# Patient Record
Sex: Male | Born: 1985 | Race: Black or African American | Hispanic: No | Marital: Single | Smoking: Current some day smoker
Health system: Southern US, Community
[De-identification: ages and names within clinical notes are randomized; demographics above are authoritative.]

---
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
## 2014-04-11 ENCOUNTER — Encounter (HOSPITAL_COMMUNITY): Payer: Self-pay | Admitting: *Deleted

## 2014-04-11 ENCOUNTER — Emergency Department (HOSPITAL_COMMUNITY)
Admission: EM | Admit: 2014-04-11 | Discharge: 2014-04-11 | Disposition: A | Discharge status: Home / Self Care | Payer: Self-pay | Attending: Emergency Medicine | Admitting: Emergency Medicine

## 2014-04-11 DIAGNOSIS — R519 Headache, unspecified: Secondary | ICD-10-CM

## 2014-04-11 DIAGNOSIS — Z720 Tobacco use: Secondary | ICD-10-CM | POA: Insufficient documentation

## 2014-04-11 DIAGNOSIS — R1110 Vomiting, unspecified: Secondary | ICD-10-CM | POA: Insufficient documentation

## 2014-04-11 DIAGNOSIS — R51 Headache: Secondary | ICD-10-CM | POA: Insufficient documentation

## 2014-04-11 DIAGNOSIS — Z79899 Other long term (current) drug therapy: Secondary | ICD-10-CM | POA: Insufficient documentation

## 2014-04-11 DIAGNOSIS — Z794 Long term (current) use of insulin: Secondary | ICD-10-CM | POA: Insufficient documentation

## 2014-04-11 DIAGNOSIS — H5310 Unspecified subjective visual disturbances: Secondary | ICD-10-CM | POA: Insufficient documentation

## 2014-04-11 MED ORDER — TRAMADOL HCL 50 MG PO TABS: 50.0000 mg | ORAL_TABLET | Freq: Four times a day (QID) | ORAL | Status: AC | PRN

## 2014-04-11 MED ORDER — ONDANSETRON HCL 4 MG PO TABS: 4.0000 mg | ORAL_TABLET | Freq: Four times a day (QID) | ORAL | Status: AC

## 2014-04-11 NOTE — ED Notes (Signed)
CBG- 92 

## 2014-04-11 NOTE — Discharge Instructions (Signed)
Recurrent Migraine Headache A migraine headache is an intense, throbbing pain on one or both sides of your head. Recurrent migraines keep coming back. A migraine can last for 30 minutes to several hours. CAUSES  The exact cause of a migraine headache is not always known. However, a migraine may be caused when nerves in the brain become irritated and release chemicals that cause inflammation. This causes pain. Certain things may also trigger migraines, such as:   Alcohol.  Smoking.  Stress.  Menstruation.  Aged cheeses.  Foods or drinks that contain nitrates, glutamate, aspartame, or tyramine.  Lack of sleep.  Chocolate.  Caffeine.  Hunger.  Physical exertion.  Fatigue.  Medicines used to treat chest pain (nitroglycerine), birth control pills, estrogen, and some blood pressure medicines. SYMPTOMS   Pain on one or both sides of your head.  Pulsating or throbbing pain.  Severe pain that prevents daily activities.  Pain that is aggravated by any physical activity.  Nausea, vomiting, or both.  Dizziness.  Pain with exposure to bright lights, loud noises, or activity.  General sensitivity to bright lights, loud noises, or smells. Before you get a migraine, you may get warning signs that a migraine is coming (aura). An aura may include:  Seeing flashing lights.  Seeing bright spots, halos, or zigzag lines.  Having tunnel vision or blurred vision.  Having feelings of numbness or tingling.  Having trouble talking.  Having muscle weakness. DIAGNOSIS  A recurrent migraine headache is often diagnosed based on:  Symptoms.  Physical examination.  A CT scan or MRI of your head. These imaging tests cannot diagnose migraines but can help rule out other causes of headaches.  TREATMENT  Medicines may be given for pain and nausea. Medicines can also be given to help prevent recurrent migraines. HOME CARE INSTRUCTIONS  Only take over-the-counter or prescription  medicines for pain or discomfort as directed by your health care provider. The use of long-term narcotics is not recommended.  Lie down in a dark, quiet room when you have a migraine.  Keep a journal to find out what may trigger your migraine headaches. For example, write down:  What you eat and drink.  How much sleep you get.  Any change to your diet or medicines.  Limit alcohol consumption.  Quit smoking if you smoke.  Get 7-9 hours of sleep, or as recommended by your health care provider.  Limit stress.  Keep lights dim if bright lights bother you and make your migraines worse. SEEK MEDICAL CARE IF:   You do not get relief from the medicines given to you.  You have a recurrence of pain.  You have a fever. SEEK IMMEDIATE MEDICAL CARE IF:  Your migraine becomes severe.  You have a stiff neck.  You have loss of vision.  You have muscular weakness or loss of muscle control.  You start losing your balance or have trouble walking.  You feel faint or pass out.  You have severe symptoms that are different from your first symptoms. MAKE SURE YOU:   Understand these instructions.  Will watch your condition.  Will get help right away if you are not doing well or get worse. Document Released: 12/27/2000 Document Revised: 08/18/2013 Document Reviewed: 12/09/2012 [employer] Patient Information 2015 [employer]. This information is not intended to replace advice given to you by your health care provider. Make sure you discuss any questions you have with your health care provider.  

## 2014-04-11 NOTE — ED Provider Notes (Signed)
CSN: [id number]     Arrival date & time 04/11/14  1257 History  This chart was scribed for non-physician practitioner Marlon Peliffany Brianne Maina, PA-C working with Lyanne CoKevin M Campos, MD by Conchita ParisNadim Abuhashem, ED Scribe. This patient was seen in [facility] and the patient's care was started at 2:00 PM.    Chief Complaint  Patient presents with  . Headache  . Emesis   Patient is a 28 y.o. male presenting with headaches and vomiting. The history is provided by the patient. No language interpreter was used.  Headache Associated symptoms: dizziness, photophobia and vomiting   Emesis Associated symptoms: headaches     HPI Comments: Blake Contreras is a 28 y.o. male who presents to the Emergency Department complaining of re-occuring HA located at his bilateral temple worse on the right and behind his eyes.The HA are primarily at night and has occurs about 3 times a month. Pt states this complaint have been ongoing for years and its frequency has increased this past year. He currently has a small HA. He has emesis, photosensitivity, dizziness and imbalance as associated symptoms. Cooling down provides relief. Tylenol, Aleve and Excedrin occasionally provides relief.   He has been told it maybe about his blood sugar levels. Pt has glasses and does not wear them and has not seen an eye doctor since 2012. He works at [employer], he endorses significant strain on his eyes at work. His significant other tells us he needs to squint frequently to see things.  History reviewed. No pertinent past medical history. History reviewed. No pertinent past surgical history. History reviewed. No pertinent family history. History  Substance Use Topics  . Smoking status: Current Every Day Smoker    Types: Cigars  . Smokeless tobacco: Not on file  . Alcohol Use: Yes     Comment: occasionally    Review of Systems  Eyes: Positive for photophobia.  Gastrointestinal: Positive for vomiting.  Neurological: Positive for  dizziness and headaches.  All other systems reviewed and are negative.  Allergies  Other  Home Medications   Prior to Admission medications   Medication Sig Start Date End Date Taking? Authorizing Provider  Aspirin-Salicylamide-Caffeine (BC HEADACHE POWDER PO) Take 1 each by mouth every 2 (two) hours.   Yes Historical Provider, MD  naproxen sodium (ALEVE) 220 MG tablet Take 440-660 mg by mouth 2 (two) times daily as needed (pain).   Yes Historical Provider, MD  ondansetron (ZOFRAN) 4 MG tablet Take 1 tablet (4 mg total) by mouth every 6 (six) hours. 04/11/14   Dorthula Matasiffany G Violia Knopf, PA-C  traMADol (ULTRAM) 50 MG tablet Take 1 tablet (50 mg total) by mouth every 6 (six) hours as needed. 04/11/14   Macey Wurtz Irine SealG Keagon Glascoe, PA-C   BP 123/73 mmHg  Pulse 56  Temp(Src) 97.5 F (36.4 C) (Oral)  SpO2 100% Physical Exam  Constitutional: He is oriented to person, place, and time. He appears well-developed and well-nourished.  HENT:  Head: Normocephalic and atraumatic.  Eyes: EOM are normal.  Neck: Normal range of motion.  Cardiovascular: Normal rate.   Pulmonary/Chest: Effort normal.  Musculoskeletal: Normal range of motion.  Neurological: He is alert and oriented to person, place, and time.  Cranial nerves II-VIII and X-XII evaluated and show no deficits. Pt alert and oriented x 3 Upper and lower extremity strength is symmetrical and physiologic Normal muscular tone No facial droop Coordination intact, no limb ataxia, finger-nose-finger normal Rapid alternating movements normal No pronator drift  Skin: Skin is warm and  dry.  Psychiatric: He has a normal mood and affect. His behavior is normal.  Nursing note and vitals reviewed.   ED Course  Procedures  DIAGNOSTIC STUDIES: Oxygen Saturation is 100% on room air, normal by my interpretation.    COORDINATION OF CARE: 2:10 PM Discussed treatment plan with pt at bedside and pt agreed to plan.  Labs Review Labs Reviewed  CBG MONITORING, ED     Imaging Review No results found.   EKG Interpretation None     MDM   Final diagnoses:  Eye strain, bilateral  Frequent headaches   Visual Acuity: Visual Acuity - Bilateral Distance: 20/40 ; R Distance: 20/70 ; L Distance: 20/50 CBG: 92  I recommends patient receive an eye exam and wear glasses since both of his job require looking at a little computer screen and he admits to significant strain. Referred to Optometrist, if headaches continue will need to see PCP or neuro,.  28 y.o.Fabio Ator's evaluation in the Emergency Department is complete. It has been determined that no acute conditions requiring further emergency intervention are present at this time. The patient/guardian have been advised of the diagnosis and plan. We have discussed signs and symptoms that warrant return to the ED, such as changes or worsening in symptoms.  Vital signs are stable at discharge. Filed Vitals:   04/11/14 1317  BP: 123/73  Pulse: 56  Temp: 97.5 F (36.4 C)    Patient/guardian has voiced understanding and agreed to follow-up with the PCP or specialist.  The patient denies any symptoms of neurological impairment or TIA's; no amaurosis, diplopia, dysphasia, or unilateral disturbance of motor or sensory function. No loss of balance or vertigo.    Dorthula Matasiffany G Arek Spadafore, PA-C 04/11/14 1808  Lyanne CoKevin M Campos, MD 04/12/14 [id number]

## 2014-04-11 NOTE — ED Notes (Addendum)
Patient states that for the past year he has been having episodes of headache followed by emesis. He states that sometimes he has a headache the night before and has the emesis the following morning with no association to food. He states he has about 2 or 3 a month. He states today that it was so bad that he could not go to work. He has been tylenol, aleve and excedrin for the headaches. He states he usually feels relief after the emesis. He complains of sensitivity to light with the headaches and fatigue after the emesis. He has not been seen for this complaint before. He denies headache and emesis at this time.

## 2014-04-11 NOTE — ED Notes (Signed)
Pt left without discharge instructions.

## 2014-04-13 LAB — CBG MONITORING, ED: Glucose-Capillary: 92 mg/dL (ref 70–99)

## 2015-02-17 ENCOUNTER — Encounter (HOSPITAL_COMMUNITY): Payer: Self-pay

## 2015-02-17 ENCOUNTER — Emergency Department (HOSPITAL_COMMUNITY)
Admission: EM | Admit: 2015-02-17 | Discharge: 2015-02-17 | Disposition: A | Discharge status: Home / Self Care | Payer: Self-pay | Attending: Emergency Medicine | Admitting: Emergency Medicine

## 2015-02-17 DIAGNOSIS — K029 Dental caries, unspecified: Secondary | ICD-10-CM

## 2015-02-17 DIAGNOSIS — Z720 Tobacco use: Secondary | ICD-10-CM | POA: Insufficient documentation

## 2015-02-17 DIAGNOSIS — K0263 Dental caries on smooth surface penetrating into pulp: Secondary | ICD-10-CM | POA: Insufficient documentation

## 2015-02-17 MED ORDER — AMOXICILLIN 500 MG PO TABS: 1000.0000 mg | ORAL_TABLET | Freq: Two times a day (BID) | ORAL | Status: DC

## 2015-02-17 MED ORDER — IBUPROFEN 800 MG PO TABS
800.0000 mg | ORAL_TABLET | Freq: Once | ORAL | Status: AC
  Administered 2015-02-17: 800 mg via ORAL
  Filled 2015-02-17: qty 1

## 2015-02-17 MED ORDER — AMOXICILLIN 500 MG PO CAPS
1000.0000 mg | ORAL_CAPSULE | Freq: Three times a day (TID) | ORAL | Status: DC
  Administered 2015-02-17: 1000 mg via ORAL
  Filled 2015-02-17: qty 2

## 2015-02-17 MED ORDER — TRAMADOL HCL 50 MG PO TABS: 100.0000 mg | ORAL_TABLET | Freq: Four times a day (QID) | ORAL | Status: DC | PRN

## 2015-02-17 MED ORDER — IBUPROFEN 800 MG PO TABS: 800.0000 mg | ORAL_TABLET | Freq: Three times a day (TID) | ORAL | Status: AC

## 2015-02-17 NOTE — Discharge Instructions (Signed)
Dental Care and Dentist Visits °Dental care supports good overall health. Regular dental visits can also help you avoid dental pain, bleeding, infection, and other more serious health problems in the future. It is important to keep the mouth healthy because diseases in the teeth, gums, and other oral tissues can spread to other areas of the body. Some problems, such as diabetes, heart disease, and pre-term labor have been associated with poor oral health.  °See your dentist every 6 months. If you experience emergency problems such as a toothache or broken tooth, go to the dentist right away. If you see your dentist regularly, you may catch problems early. It is easier to be treated for problems in the early stages.  °WHAT TO EXPECT AT A DENTIST VISIT  °Your dentist will look for many common oral health problems and recommend proper treatment. At your regular dental visit, you can expect: °· Gentle cleaning of the teeth and gums. This includes scraping and polishing. This helps to remove the sticky substance around the teeth and gums (plaque). Plaque forms in the mouth shortly after eating. Over time, plaque hardens on the teeth as tartar. If tartar is not removed regularly, it can cause problems. Cleaning also helps remove stains. °· Periodic X-rays. These pictures of the teeth and supporting bone will help your dentist assess the health of your teeth. °· Periodic fluoride treatments. Fluoride is a natural mineral shown to help strengthen teeth. Fluoride treatment involves applying a fluoride gel or varnish to the teeth. It is most commonly done in children. °· Examination of the mouth, tongue, jaws, teeth, and gums to look for any oral health problems, such as: °· Cavities (dental caries). This is decay on the tooth caused by plaque, sugar, and acid in the mouth. It is best to catch a cavity when it is small. °· Inflammation of the gums caused by plaque buildup (gingivitis). °· Problems with the mouth or malformed  or misaligned teeth. °· Oral cancer or other diseases of the soft tissues or jaws.  °KEEP YOUR TEETH AND GUMS HEALTHY °For healthy teeth and gums, follow these general guidelines as well as your dentist's specific advice: °· Have your teeth professionally cleaned at the dentist every 6 months. °· Brush twice daily with a fluoride toothpaste. °· Floss your teeth daily.  °· Ask your dentist if you need fluoride supplements, treatments, or fluoride toothpaste. °· Eat a healthy diet. Reduce foods and drinks with added sugar. °· Avoid smoking. °TREATMENT FOR ORAL HEALTH PROBLEMS °If you have oral health problems, treatment varies depending on the conditions present in your teeth and gums. °· Your caregiver will most likely recommend good oral hygiene at each visit. °· For cavities, gingivitis, or other oral health disease, your caregiver will perform a procedure to treat the problem. This is typically done at a separate appointment. Sometimes your caregiver will refer you to another dental specialist for specific tooth problems or for surgery. °SEEK IMMEDIATE DENTAL CARE IF: °· You have pain, bleeding, or soreness in the gum, tooth, jaw, or mouth area. °· A permanent tooth becomes loose or separated from the gum socket. °· You experience a blow or injury to the mouth or jaw area. °  °This information is not intended to replace advice given to you by your health care provider. Make sure you discuss any questions you have with your health care provider. °  °Document Released: 12/14/2010 Document Revised: 06/26/2011 Document Reviewed: 12/14/2010 °Elsevier Interactive Patient Education ©2016 Elsevier Inc. ° °Dental Caries °Dental   caries (also called tooth decay) is the most common oral disease. It can occur at any age but is more common in children and young adults.  °HOW DENTAL CARIES DEVELOPS  °The process of decay begins when bacteria and foods (particularly sugars and starches) combine in your mouth to produce plaque.  Plaque is a substance that sticks to the hard, outer surface of a tooth (enamel). The bacteria in plaque produce acids that attack enamel. These acids may also attack the root surface of a tooth (cementum) if it is exposed. Repeated attacks dissolve these surfaces and create holes in the tooth (cavities). If left untreated, the acids destroy the other layers of the tooth.  °RISK FACTORS °· Frequent sipping of sugary beverages.   °· Frequent snacking on sugary and starchy foods, especially those that easily get stuck in the teeth.   °· Poor oral hygiene.   °· Dry mouth.   °· Substance abuse such as methamphetamine abuse.   °· Broken or poor-fitting dental restorations.   °· Eating disorders.   °· Gastroesophageal reflux disease (GERD).   °· Certain radiation treatments to the head and neck. °SYMPTOMS °In the early stages of dental caries, symptoms are seldom present. Sometimes white, chalky areas may be seen on the enamel or other tooth layers. In later stages, symptoms may include: °· Pits and holes on the enamel. °· Toothache after sweet, hot, or cold foods or drinks are consumed. °· Pain around the tooth. °· Swelling around the tooth. °DIAGNOSIS  °Most of the time, dental caries is detected during a regular dental checkup. A diagnosis is made after a thorough medical and dental history is taken and the surfaces of your teeth are checked for signs of dental caries. Sometimes special instruments, such as lasers, are used to check for dental caries. Dental X-ray exams may be taken so that areas not visible to the eye (such as between the contact areas of the teeth) can be checked for cavities.  °TREATMENT  °If dental caries is in its early stages, it may be reversed with a fluoride treatment or an application of a remineralizing agent at the dental office. Thorough brushing and flossing at home is needed to aid these treatments. If it is in its later stages, treatment depends on the location and extent of tooth  destruction:  °· If a small area of the tooth has been destroyed, the destroyed area will be removed and cavities will be filled with a material such as gold, silver amalgam, or composite resin.   °· If a large area of the tooth has been destroyed, the destroyed area will be removed and a cap (crown) will be fitted over the remaining tooth structure.   °· If the center part of the tooth (pulp) is affected, a procedure called a root canal will be needed before a filling or crown can be placed.   °· If most of the tooth has been destroyed, the tooth may need to be pulled (extracted). °HOME CARE INSTRUCTIONS °You can prevent, stop, or reverse dental caries at home by practicing good oral hygiene. Good oral hygiene includes: °· Thoroughly cleaning your teeth at least twice a day with a toothbrush and dental floss.   °· Using a fluoride toothpaste. A fluoride mouth rinse may also be used if recommended by your dentist or health care provider.   °· Restricting the amount of sugary and starchy foods and sugary liquids you consume.   °· Avoiding frequent snacking on these foods and sipping of these liquids.   °· Keeping regular visits with a dentist for   checkups and cleanings. °PREVENTION  °· Practice good oral hygiene. °· Consider a dental sealant. A dental sealant is a coating material that is applied by your dentist to the pits and grooves of teeth. The sealant prevents food from being trapped in them. It may protect the teeth for several years. °· Ask about fluoride supplements if you live in a community without fluorinated water or with water that has a low fluoride content. Use fluoride supplements as directed by your dentist or health care provider. °· Allow fluoride varnish applications to teeth if directed by your dentist or health care provider. °  °This information is not intended to replace advice given to you by your health care provider. Make sure you discuss any questions you have with your health care  provider. °  °Document Released: 12/24/2001 Document Revised: 04/24/2014 Document Reviewed: 04/05/2012 °Elsevier Interactive Patient Education ©2016 Elsevier Inc. ° ° ° °Emergency Department Resource Guide °1) Find a Doctor and Pay Out of Pocket °Although you won't have to find out who is covered by your insurance plan, it is a good idea to ask around and get recommendations. You will then need to call the office and see if the doctor you have chosen will accept you as a new patient and what types of options they offer for patients who are self-pay. Some doctors offer discounts or will set up payment plans for their patients who do not have insurance, but you will need to ask so you aren't surprised when you get to your appointment. ° °2) Contact Your Local Health Department °Not all health departments have doctors that can see patients for sick visits, but many do, so it is worth a call to see if yours does. If you don't know where your local health department is, you can check in your phone book. The CDC also has a tool to help you locate your state's health department, and many state websites also have listings of all of their local health departments. ° °3) Find a Walk-in Clinic °If your illness is not likely to be very severe or complicated, you may want to try a walk in clinic. These are popping up all over the country in pharmacies, drugstores, and shopping centers. They're usually staffed by nurse practitioners or physician assistants that have been trained to treat common illnesses and complaints. They're usually fairly quick and inexpensive. However, if you have serious medical issues or chronic medical problems, these are probably not your best option. ° °No Primary Care Doctor: °- Call [facility] at  832-8000 - they can help you locate a primary care doctor that  accepts your insurance, provides certain services, etc. °- Physician Referral Service- 1-800-533-3463 ° °Chronic Pain  Problems: °Organization         Address  Phone   Notes  °[facility]  (336) 297-2271 Patients need to be referred by their primary care doctor.  ° °Medication Assistance: °Organization         Address  Phone   Notes  °[place] [facility] [address] °[place], [place] [address] (336) 641-8030 --Must be a resident of [place] °-- Must have NO insurance coverage whatsoever (no Medicaid/ Medicare, etc.) °-- The pt. MUST have a primary care doctor that directs their care regularly and follows them in the community °  °[facility]  (866) 331-1348   °[employer]  (888) 892-1162   ° °Agencies that provide inexpensive medical care: °Organization           Address  Phone   Notes  °[facility]  (336) 832-8035   °[facility]    (336) 832-7272   °[facility] [address] °[place], [place] [address] (336) 832-4777   °[facility] [address], °[place] (336) 271-4999   °[facility]    (336) 373-0678   °[facility]    (336) 272-1050   °[facility] ° [address], [place] Phone:  (336) 832-4444, Fax:  (336) 832-4440 Hours of Operation:  9 am - 6 pm, M-F.  Also accepts Medicaid/Medicare and self-pay.  °[facility] ° [address], [place] Phone: (336) 832-3150, Fax: (336) [phone]. Hours of Operation:  8:30 am - 5:30 pm, M-F.  Also accepts Medicaid and self-pay.  °[facility] [place] [address], [place] Phone: (336) 878-6027   °[facility] [address], [place], [place] (336)723-1848, Ext. 123 Mondays & Thursdays: 7-9 AM.  First 15 patients are seen on a first come, first serve basis. °  ° °Medicaid-accepting [place] Providers: ° °Organization         Address  Phone   Notes  °[facility] [address], [place] (336) 641-2100 Also  accepts self-pay patients.  °[facility] [address], [place] ° (336) 856-9996   °[facility] [address], [place] (336) 288-8857   °[facility] [address] [place] [address], [place] (336) 299-7000   °Veita Bland [address], [place]  ° (336) 373-1557 Only accepts [place] Access Medicaid patients after they have their name applied to their card.  ° °Self-Pay (no insurance) in [place]: ° °Organization         Address  Phone   Notes  °Sickle Cell Patients, [facility] [address], [place] (336) 832-1970   °[facility] [address], [place] (336) 832-4400   °[facility] ° [address], [place] (336) 992-4800   °[facility]/Dr. Osei-Bonsu ° [address] [place] [address], [place] or [address], [place] (336) [phone] Phone number for both [place] and [place] locations is the same.  °[facility] [address], [place] (336) 299-0000   °[facility] [address] [place] [address], [place] or [address] (336) 852-7530 °(336) 878-2260   °[facility] [address], [place] (336) 350-1642, phone; (336) 294-5005, fax Sees patients 1st and 3rd Saturday of every month.  Must not qualify for public or private insurance (i.e. Medicaid, Medicare, [place] Health Choice, Veterans' Benefits) • Household income should be no more than 200% of the poverty level •The clinic cannot treat you if you are pregnant or think you are pregnant • Sexually transmitted diseases are not treated at the clinic.  ° ° °Dental Care: °Organization         Address  Phone  Notes  °[facility] [address],  (336) 641-6152 Accepts children up to age 21 who are enrolled in Medicaid or [place] Health Choice; pregnant  women with a Medicaid card; and children who have applied for Medicaid or  Health Choice, but were declined, whose parents can pay a reduced fee at time   of service.  °[facility] [place]  [address], [place] (336) 641-7733 Accepts children up to age 21 who are enrolled in Medicaid or [place] Health Choice; pregnant women with a Medicaid card; and children who have applied for Medicaid or [place] Health Choice, but were declined, whose parents can pay a reduced fee at time of service.  °[facility] ° [address], [place] (336) 641-4533 Patients are seen by appointment only. Walk-ins are not accepted. [facility] will see patients 18 years of age and older. °Monday - Tuesday (8am-5pm) °Most Wednesdays (8:30-5pm) °$30 per visit, cash only  °[facility] ° [address], [place] (336) 641-4533 Patients are seen by appointment only. Walk-ins are not accepted. [facility] will see patients 18 years of age and older. °One Wednesday Evening (Monthly: Volunteer Based).  $30 per visit, cash only  °[facility]  (919) [phone] for adults; Children under age 4, call Graduate Pediatric Dentistry at ([phone]) [phone]. Children aged 4-14, please call ([phone]) [phone] to request a pediatric application. ° Dental services are provided in all areas of dental care including fillings, crowns and bridges, complete and partial dentures, implants, gum treatment, root canals, and extractions. Preventive care is also provided. Treatment is provided to both adults and children. °Patients are selected via a lottery and there is often a waiting list. °  °[facility] [address], °[place] ° (336) 763-8833 [email or web address] °  °[facility] [address], [place], [place] (336)723-1848, Ext. 123 Second and Fourth Thursday of each month, opens at 6:30 AM; Clinic ends at 9 AM.  Patients are  seen on a first-come first-served basis, and a limited number are seen during each clinic.  ° °[facility] ° [address], [place], [place] (336) 723-7904   Eligibility Requirements °You must have lived in [place], [place], or [place] counties for at least the last three months. °  You cannot be eligible for state or federal sponsored healthcare insurance, including Veterans Administration, Medicaid, or Medicare. °  You generally cannot be eligible for healthcare insurance through your employer.  °  How to apply: °Eligibility screenings are held every Tuesday and Wednesday afternoon from 1:00 pm until 4:00 pm. You do not need an appointment for the interview!  °[facility] [address], [place], [place] 336-631-2330   °[facility]  336-342-8273   °[facility]  336-703-3100   °[facility]  336-570-6415   ° °Behavioral Health Resources in the Community: °Intensive Outpatient Programs °Organization         Address  Phone  Notes  °[facility] [address], [place], [place] 336-878-6098   °[facility] [address], [place], [place] 336-832-9800   °[facility] [address], [place],  ° 336-882-2125   °[facility] [address],  °[place],  1-800-853-5163 or 336-641-4981   °Substance Abuse Resources °Organization         Address  Phone  Notes  °[facility]  336-882-2125   °[facility]  336-784-9470   °[facility]  336-285-9073   °[facility]  336-845-3988   °Residential & Outpatient Substance Abuse Program  1-800-659-3381   °Psychological Services °Organization         Address  Phone    Notes  °[facility]  336- 832-9600   °[facility]  336- 378-7881   °[facility] [address], [place] 1-800-853-5163 or 336-641-4981   ° °Mobile Crisis  Teams °Organization         Address  Phone  Notes  °[facility], Mobile Crisis Care Unit  1-877-626-1772   °Assertive °[facility] ° [address] [place], [place] 336-834-9664   °Sharon DeEsch [address] °[place] 336-554-5454   ° °Self-Help/Support Groups °Organization         Address  Phone             Notes  °[facility] [place] - variety of support groups  336- 373-1402 Call for more information  °Narcotics Anonymous (NA), [facility] [address], °[place]  2 meetings at this location  ° °Residential Treatment Programs °Organization         Address  Phone  Notes  °[facility] [address],    °[place]  1-866-801-8205   °[facility] ° [address], [place], [place] 704-293-8524   °[facility] [address], [place] 336-845-3988 Admissions: 8am-3pm M-F  °[facility] [address]    °[place], [place] 336-841-1104   °[facility] [address], [place], [place] 336-379-7146   °[facility] [address]  °[place], [place] 336-285-9073   °[facility] Intensive Outpatient [address], [place], [place] 336-852-3033   °[facility] [address]  °[place], [place] 1-877-615-2722 or 336-784-9470   °[facility] [address] [place], [place] 336-227-7417 Accepts Medicaid  °[facility] [address]  °[place] 1-800-659-3381 Substance Abuse/Addiction Treatment  ° °[place] Behavioral Health Resources °Organization         Address  Phone  Notes  °[facility]  (888) 581-9988   °Julie Brannon, PhD [address] [place], [place]   (336) 349-5553 or (336) 951-0000   °[facility]   [address] °[place], [place] (336) 349-4454   °[facility] [address], [place], [place] (336) 342-8316  Insurance/Medicaid/sponsorship through [facility]  °[facility] [address]                                    [place], [place] (336) 342-8316 Therapy/tele-psych/case  °[facility] [address]  ° [place], [place] (336) 349-2233    °Dr. Arfeen  (336) 349-4544   °[facility] [place]  [employer] [place] [facility] 1) [address], [place] °2) [address], [place] °3)  [address], [place] (336) 349-3220 °(336) 342-7768 ° °(336) 342-8140   °[place] [facility] (336) 342-1394 or (336) 342-3537 (After Hours)    ° ° ° °

## 2015-02-17 NOTE — ED Notes (Signed)
Pt c/o increasing L lower dental pain x 1 month.  Pain score 10/10.  Pt reports taking BC w/ a little relief.  Pt sts he does not have dental insurance.

## 2015-02-17 NOTE — ED Notes (Signed)
Verbalized understanding discharge instructions. In no acute distress.   

## 2015-02-17 NOTE — ED Provider Notes (Signed)
CSN: [id number]     Arrival date & time 02/17/15  [id number] History   First MD Initiated Contact with Patient 02/17/15 0911     Chief Complaint  Patient presents with  . Dental Pain     (Consider location/radiation/quality/duration/timing/severity/associated sxs/prior Treatment) HPI Patient has been having problems with a lower left molar for about a month. He reports that he has been unable to get dental care for it. He states over the past several days the pain is increasing use a lot of pain that comes up underneath his chin. He reports sometimes her as a frothy kind of discharge from around the tooth. He has not noted any facial swelling. There's been no fever. History reviewed. No pertinent past medical history. History reviewed. No pertinent past surgical history. History reviewed. No pertinent family history. Social History  Substance Use Topics  . Smoking status: Current Some Day Smoker    Types: Cigars  . Smokeless tobacco: None  . Alcohol Use: Yes     Comment: occasionally    Review of Systems Constitutional: No fevers or chills ENT no nasal congestion sore throat or difficulty swallowing.   Allergies  Other  Home Medications   Prior to Admission medications   Medication Sig Start Date End Date Taking? Authorizing Provider  Aspirin-Caffeine (BC FAST PAIN RELIEF PO) Take 2 packets by mouth every 8 (eight) hours as needed (for pain).   Yes Historical Provider, MD  naproxen sodium (ALEVE) 220 MG tablet Take 440-660 mg by mouth 2 (two) times daily as needed (pain).   Yes Historical Provider, MD  amoxicillin (AMOXIL) 500 MG tablet Take 2 tablets (1,000 mg total) by mouth 2 (two) times daily. 02/17/15   Arby BarretteMarcy Kortland Nichols, MD  ibuprofen (ADVIL,MOTRIN) 800 MG tablet Take 1 tablet (800 mg total) by mouth 3 (three) times daily. 02/17/15   Arby BarretteMarcy Akiem Urieta, MD  ondansetron (ZOFRAN) 4 MG tablet Take 1 tablet (4 mg total) by mouth every 6 (six) hours. Patient not taking: Reported on  02/17/2015 04/11/14   Marlon Peliffany Greene, PA-C  traMADol (ULTRAM) 50 MG tablet Take 1 tablet (50 mg total) by mouth every 6 (six) hours as needed. Patient not taking: Reported on 02/17/2015 04/11/14   Marlon Peliffany Greene, PA-C  traMADol (ULTRAM) 50 MG tablet Take 2 tablets (100 mg total) by mouth every 6 (six) hours as needed (You may take this with ibuprofen if you need additional pain control.). 02/17/15   Arby BarretteMarcy Roshunda Keir, MD   BP 120/74 mmHg  Pulse 98  Temp(Src) 97.9 F (36.6 C) (Oral)  Resp 18  SpO2 98% Physical Exam  Constitutional: He is oriented to person, place, and time. He appears well-developed and well-nourished.  HENT:  Head: Normocephalic and atraumatic.  Right Ear: External ear normal.  Left Ear: External ear normal.  Nose: Nose normal.  No facial edema or swelling. There is some tenderness along the left inferior border of the mandible. Intraoral cavity; severe decay of the posterior most left inferior molar. Other dentition is in fair condition with plaque present and mild gingivitis.  Neck: Neck supple.  Pulmonary/Chest: Effort normal.  Lymphadenopathy:    He has no cervical adenopathy.  Neurological: He is alert and oriented to person, place, and time. No cranial nerve deficit.  Skin: Skin is warm and dry.  Psychiatric: He has a normal mood and affect.    ED Course  Procedures (including critical care time) Labs Review Labs Reviewed - No data to display  Imaging Review No results found. I  have personally reviewed and evaluated these images and lab results as part of my medical decision-making.   EKG Interpretation None      MDM   Final diagnoses:  Dental caries into pulp   Patient presents with acute exacerbation of chronically decaying molar. At this time with pain exacerbation and discharged from the tooth. Findings are consistent with apical abscess. Currently there is no facial swelling or fever. Patient will be started on antibiotics and ibuprofen and  tramadol for pain control. Patient is counseled on dentistry follow-up, dental hygiene and smoking cessation.    [name], MD 02/17/15 [phone]

## 2015-03-13 ENCOUNTER — Encounter (HOSPITAL_COMMUNITY): Payer: Self-pay | Admitting: Emergency Medicine

## 2015-03-13 ENCOUNTER — Emergency Department (HOSPITAL_COMMUNITY)
Admission: EM | Admit: 2015-03-13 | Discharge: 2015-03-13 | Disposition: A | Discharge status: Home / Self Care | Payer: Self-pay | Attending: Emergency Medicine | Admitting: Emergency Medicine

## 2015-03-13 DIAGNOSIS — K002 Abnormalities of size and form of teeth: Secondary | ICD-10-CM | POA: Insufficient documentation

## 2015-03-13 DIAGNOSIS — K0381 Cracked tooth: Secondary | ICD-10-CM | POA: Insufficient documentation

## 2015-03-13 DIAGNOSIS — Z791 Long term (current) use of non-steroidal anti-inflammatories (NSAID): Secondary | ICD-10-CM | POA: Insufficient documentation

## 2015-03-13 DIAGNOSIS — K029 Dental caries, unspecified: Secondary | ICD-10-CM | POA: Insufficient documentation

## 2015-03-13 DIAGNOSIS — F17210 Nicotine dependence, cigarettes, uncomplicated: Secondary | ICD-10-CM | POA: Insufficient documentation

## 2015-03-13 DIAGNOSIS — S025XXA Fracture of tooth (traumatic), initial encounter for closed fracture: Secondary | ICD-10-CM

## 2015-03-13 DIAGNOSIS — Z792 Long term (current) use of antibiotics: Secondary | ICD-10-CM | POA: Insufficient documentation

## 2015-03-13 DIAGNOSIS — K047 Periapical abscess without sinus: Secondary | ICD-10-CM

## 2015-03-13 MED ORDER — AMOXICILLIN 500 MG PO TABS: 1000.0000 mg | ORAL_TABLET | Freq: Two times a day (BID) | ORAL | Status: AC

## 2015-03-13 MED ORDER — OXYCODONE-ACETAMINOPHEN 5-325 MG PO TABS
1.0000 | ORAL_TABLET | Freq: Once | ORAL | Status: AC
  Administered 2015-03-13: 1 via ORAL
  Filled 2015-03-13: qty 1

## 2015-03-13 MED ORDER — TRAMADOL HCL 50 MG PO TABS: 100.0000 mg | ORAL_TABLET | Freq: Four times a day (QID) | ORAL | Status: AC | PRN

## 2015-03-13 NOTE — ED Notes (Signed)
Pt states that his tooth broke yesterday.  C/o pain to lt side of face.

## 2015-03-13 NOTE — ED Provider Notes (Signed)
CSN: [id number]     Arrival date & time 03/13/15  1225 History  By signing my name below, I, Blake Contreras, attest that this documentation has been prepared under the direction and in the presence of Blake Holcomb, PA-C. Electronically Signed: Elon Contreras ED Scribe. 03/13/2015. 1:24 PM.    Chief Complaint  Patient presents with  . Dental Pain   The history is provided by the patient. No language interpreter was used.   HPI Comments: Blake Contreras is a 29 y.o. male who presents to the Emergency Department complaining of constant, moderate left lower dental pain that recurred yesterday after fracturing a tooth.  The patient reports he was seen in the ED on 11/2 for 1 month of dental pain in the same area.  The pain resolved completely with antibiotics and the patient was unable to f/u with a dentist due to financial reasons/scheduling difficulties.  He denies fever, trouble swallowing, trismus, facial swelling.   History reviewed. No pertinent past medical history. No past surgical history on file. No family history on file. Social History  Substance Use Topics  . Smoking status: Current Some Day Smoker    Types: Cigars  . Smokeless tobacco: None  . Alcohol Use: Yes     Comment: occasionally    Review of Systems A complete 10 system review of systems was obtained and all systems are negative except as noted in the HPI and PMH.   Allergies  Other  Home Medications   Prior to Admission medications   Medication Sig Start Date End Date Taking? Authorizing Provider  amoxicillin (AMOXIL) 500 MG tablet Take 2 tablets (1,000 mg total) by mouth 2 (two) times daily. 02/17/15   Blake Barrette, MD  Aspirin-Caffeine (BC FAST PAIN RELIEF PO) Take 2 packets by mouth every 8 (eight) hours as needed (for pain).    Historical Provider, MD  ibuprofen (ADVIL,MOTRIN) 800 MG tablet Take 1 tablet (800 mg total) by mouth 3 (three) times daily. 02/17/15   Blake Barrette, MD  naproxen sodium (ALEVE) 220 MG  tablet Take 440-660 mg by mouth 2 (two) times daily as needed (pain).    Historical Provider, MD  ondansetron (ZOFRAN) 4 MG tablet Take 1 tablet (4 mg total) by mouth every 6 (six) hours. Patient not taking: Reported on 02/17/2015 04/11/14   Blake Pel, PA-C  traMADol (ULTRAM) 50 MG tablet Take 1 tablet (50 mg total) by mouth every 6 (six) hours as needed. Patient not taking: Reported on 02/17/2015 04/11/14   Blake Pel, PA-C  traMADol (ULTRAM) 50 MG tablet Take 2 tablets (100 mg total) by mouth every 6 (six) hours as needed (You may take this with ibuprofen if you need additional pain control.). 02/17/15   Blake Barrette, MD   BP 119/75 mmHg  Pulse 57  Temp(Src) 98.4 F (36.9 C) (Oral)  Resp 16  SpO2 100% Physical Exam  Constitutional: He is oriented to person, place, and time. He appears well-developed and well-nourished. No distress.  HENT:  Head: Normocephalic and atraumatic.  Mouth/Throat: Uvula is midline and mucous membranes are normal. Abnormal dentition. Dental caries present. No uvula swelling. No oropharyngeal exudate, posterior oropharyngeal edema, posterior oropharyngeal erythema or tonsillar abscesses.    No swelling under tongue  Eyes: Conjunctivae are normal. Right eye exhibits no discharge. Left eye exhibits no discharge. No scleral icterus.  Neck: Neck supple.  Cardiovascular: Normal rate.   Pulmonary/Chest: Effort normal.  Lymphadenopathy:    He has no cervical adenopathy.  Neurological: He is alert  and oriented to person, place, and time. Coordination normal.  Skin: Skin is warm and dry. No rash noted. He is not diaphoretic. No erythema. No pallor.  Psychiatric: He has a normal mood and affect. His behavior is normal.  Nursing note and vitals reviewed.   ED Course  Procedures (including critical care time)  DIAGNOSTIC STUDIES: Oxygen Saturation is 100% on RA, normal by my interpretation.    COORDINATION OF CARE:  1:24 PM Will order pain medication and  prescribe antibiotics.  Patient advised to f/u with dentist.  Will provide dental resource guide.   Labs Review Labs Reviewed - No data to display  Imaging Review No results found. I have personally reviewed and evaluated these images and lab results as part of my medical decision-making.   EKG Interpretation None      MDM   Final diagnoses:  Dental infection  Tooth fracture, closed, initial encounter    Patient with toothache after fracturing it yesterday. Pt had infection in this tooth 1 month ago.  No current gross abscess.  Exam unconcerning for Ludwig's angina or spread of infection. No systemic symptoms.  Will treat with amoxicillin and pain medicine. Urged patient to follow-up with dentist. Pt given resource guide and direct referral to on call dentist.     I personally performed the services described in this documentation, which was scribed in my presence. The recorded information has been reviewed and is accurate.     Blake KinsmanSamantha Tripp [name], PA-C 03/13/15 1414  Blake BaleElliott Wentz, MD 03/13/15 [phone]

## 2015-03-13 NOTE — Discharge Instructions (Signed)
°Emergency Department Resource Guide °1) Find a Doctor and Pay Out of Pocket °Although you won't have to find out who is covered by your insurance plan, it is a good idea to ask around and get recommendations. You will then need to call the office and see if the doctor you have chosen will accept you as a new patient and what types of options they offer for patients who are self-pay. Some doctors offer discounts or will set up payment plans for their patients who do not have insurance, but you will need to ask so you aren't surprised when you get to your appointment. ° °2) Contact Your Local Health Department °Not all health departments have doctors that can see patients for sick visits, but many do, so it is worth a call to see if yours does. If you don't know where your local health department is, you can check in your phone book. The CDC also has a tool to help you locate your state's health department, and many state websites also have listings of all of their local health departments. ° °3) Find a Walk-in Clinic °If your illness is not likely to be very severe or complicated, you may want to try a walk in clinic. These are popping up all over the country in pharmacies, drugstores, and shopping centers. They're usually staffed by nurse practitioners or physician assistants that have been trained to treat common illnesses and complaints. They're usually fairly quick and inexpensive. However, if you have serious medical issues or chronic medical problems, these are probably not your best option. ° °No Primary Care Doctor: °- Call [facility] at  832-8000 - they can help you locate a primary care doctor that  accepts your insurance, provides certain services, etc. °- Physician Referral Service- 1-800-533-3463 ° °Chronic Pain Problems: °Organization         Address  Phone   Notes  °[facility]  (336) 297-2271 Patients need to be referred by their primary care doctor.  ° °Medication  Assistance: °Organization         Address  Phone   Notes  °[place] [facility] [address] °[place], [place] [address] (336) 641-8030 --Must be a resident of [place] °-- Must have NO insurance coverage whatsoever (no Medicaid/ Medicare, etc.) °-- The pt. MUST have a primary care doctor that directs their care regularly and follows them in the community °  °[facility]  (866) 331-1348   °[employer]  (888) 892-1162   ° °Agencies that provide inexpensive medical care: °Organization         Address  Phone   Notes  °[facility]  (336) 832-8035   °[facility]    (336) 832-7272   °[facility] [address] °[place], [place] [address] (336) 832-4777   °[facility] [address], °[place] (336) 271-4999   °[facility]    (336) 373-0678   °[facility]    (336) 272-1050   °[facility] ° [address], [place] Phone:  (336) 832-4444, Fax:  (336) 832-4440 Hours of Operation:  9 am - 6 pm, M-F.  Also accepts Medicaid/Medicare and self-pay.  °[facility] ° [address], [place] Phone: (336) 832-3150, Fax: (336) [phone]. Hours of Operation:  8:30 am - 5:30 pm, M-F.  Also accepts Medicaid and self-pay.  °[facility] [place] [address], [place] Phone: (336) 878-6027   °[facility] [address], [place], [place] (336)723-1848, Ext. 123 Mondays & Thursdays: 7-9 AM.  First 15 patients are seen on a first come, first serve basis. °  ° °Medicaid-accepting [place] Providers: ° °Organization         Address  Phone   Notes  °[facility] [address], [place] (336) 641-2100 Also accepts self-pay patients.  °[facility] [address], [place] ° (336) 856-9996   °[facility] [address], [place]  (336) 288-8857   °[facility] [address] [place] [address], [place] (336) 299-7000   °Veita Bland [address], [place]  ° (336) 373-1557 Only accepts [place] Access Medicaid patients after they have their name applied to their card.  ° °Self-Pay (no insurance) in [place]: ° °Organization         Address  Phone   Notes  °Sickle Cell Patients, [facility] [address], [place] (336) 832-1970   °[facility] [address], [place] (336) 832-4400   °[facility] ° [address], [place] (336) 992-4800   °[facility]/Dr. Osei-Bonsu ° [address] [place] [address], [place] or [address], [place] (336) 841-8500 Phone number for both [place] and [place] locations is the same.  °[facility] [address], [place] (336) 299-0000   °[facility] [address] [place] [address], [place] or [address] (336) 852-7530 °(336) 878-2260   °[facility] [address], [place] (336) 350-1642, phone; (336) 294-5005, fax Sees patients 1st and 3rd Saturday of every month.  Must not qualify for public or private insurance (i.e. Medicaid, Medicare, [place] Health Choice, Veterans' Benefits) • Household income should be no more than 200% of the poverty level •The clinic cannot treat you if you are pregnant or think you are pregnant • Sexually transmitted diseases are not treated at the clinic.  ° ° °Dental Care: °Organization         Address  Phone  Notes  °[place] [facility] [address], [place] (336) 641-6152 Accepts children up to age 21 who are enrolled in Medicaid or [place] Health Choice; pregnant women with a Medicaid card; and children who have applied for Medicaid or [place] Health Choice, but were declined, whose parents can pay a reduced fee at time of service.  °[place]  [facility] [place]  [address], [place] (336) 641-7733 Accepts children up to age 21 who are enrolled in Medicaid or [place] Health Choice; pregnant women with a Medicaid card; and children who have applied for Medicaid or [place] Health Choice, but were declined, whose parents can pay a reduced fee at time of service.  °[facility] ° [address], [place] (336) 641-4533 Patients are seen by appointment only. Walk-ins are not accepted. [facility] will see patients 18 years of age and older. °Monday - Tuesday (8am-5pm) °Most Wednesdays (8:30-5pm) °$30 per visit, cash only  °[facility] ° [address], [place] (336) 641-4533 Patients are seen by appointment only. Walk-ins are not accepted. [facility] will see patients 18 years of age and older. °One   Wednesday Evening (Monthly: Volunteer Based).  $30 per visit, cash only  °[facility]  (919) [phone] for adults; Children under age 4, call Graduate Pediatric Dentistry at ([phone]) [phone]. Children aged 4-14, please call ([phone]) [phone] to request a pediatric application. ° Dental services are provided in all areas of dental care including fillings, crowns and bridges, complete and partial dentures, implants, gum treatment, root canals, and extractions. Preventive care is also provided. Treatment is provided to both adults and children. °Patients are selected via a lottery and there is often a waiting list. °  °[facility] [address], °[place] ° (336) 763-8833 [email or web address] °  °[facility] [address], [place], [place] (336)723-1848, Ext. 123 Second and Fourth Thursday of each month, opens at 6:30 AM; Clinic ends at 9 AM.  Patients are seen on a first-come first-served basis, and a limited number are seen during each clinic.  ° °[facility] ° [address], [place], [place] (336) 723-7904    Eligibility Requirements °You must have lived in [place], [place], or [place] counties for at least the last three months. °  You cannot be eligible for state or federal sponsored healthcare insurance, including Veterans Administration, Medicaid, or Medicare. °  You generally cannot be eligible for healthcare insurance through your employer.  °  How to apply: °Eligibility screenings are held every Tuesday and Wednesday afternoon from 1:00 pm until 4:00 pm. You do not need an appointment for the interview!  °[facility] [address], [place], [place] 336-631-2330   °[facility]  336-342-8273   °[facility]  336-703-3100   °[facility]  336-570-6415   ° °Behavioral Health Resources in the Community: °Intensive Outpatient Programs °Organization         Address  Phone  Notes  °[facility] [address], [place], [place] 336-878-6098   °[facility] [address], [place], [place] 336-832-9800   °[facility] [address], [place], [place] ° 336-882-2125   °[facility] [address],  °[place], [place] 1-800-853-5163 or 336-641-4981   °Substance Abuse Resources °Organization         Address  Phone  Notes  °[facility]  336-882-2125   °[facility]  336-784-9470   °[facility]  336-285-9073   °[facility]  336-845-3988   °Residential & Outpatient Substance Abuse Program  1-800-659-3381   °Psychological Services °Organization         Address  Phone  Notes  °[facility]  336- 832-9600   °[facility]  336- 378-7881   °[facility] [address], [place] 1-800-853-5163 or 336-641-4981   ° °Mobile Crisis Teams °Organization         Address  Phone  Notes  °[facility], Mobile Crisis Care Unit  1-877-626-1772   °Assertive °[facility] ° [address]  [place], [place] 336-834-9664   °Sharon DeEsch [address] °[place] 336-554-5454   ° °Self-Help/Support Groups °Organization         Address  Phone             Notes  °[facility]  - variety of support groups  336- 373-1402 Call for more information  °Narcotics Anonymous (NA), [facility] [address], °[place]  2 meetings at this location  ° °  Residential Treatment Programs Organization         Address  Phone  Notes  [facility] [address],    [place]  [phone]) [phone]   [facility]  [address], [place] [phone], [place], [place] [phone]   [facility] [address], [place] [phone] Admissions: 8am-3pm M-F  [facility] [address]    [place], [place] [phone]   [facility] [address], [place], [place] [phone]   [facility] [address]  [place], [place] [phone]   [facility] Intensive Outpatient [address], [place], [place] [phone]) [phone]   [facility] ([facility] [address]  [place], [place] [phone] or [phone]   [facility] [address] [place], [place] [phone] Accepts Medicaid  [facility] [address]  [place] [phone] Substance Abuse/Addiction Treatment   [facility]         Address  Phone  Notes  [facility]  229-392-8132(888) 8144418981   Angie FavaJulie Brannon, PhD [address], [address] [place], [place]   [phone](336) [phone] or (215) [phone](336) [phone]   [facility]   [address] [place], [place] ([phone](336) [phone]   [facility] [address], [place], [place] 604-225-2530(336) [phone] Insurance/Medicaid/sponsorship through [facility] [address]                                    [place], [place] [phone](336) [phone] Therapy/tele-psych/case    [facility] [address]   [place], [place] [phone](336) [phone]    Dr. Lolly MustacheArfeen  ([phone](336) [phone]   [facility]  [employer] [facility] 1) [address], [place] 2) [address], [place] 3)  [address], [place] [phone](336) [phone](336) [phone]  [phone](336) [phone]   [facility] Child Abuse Hot[facility] 214-693-5896(336) 2168132001 or [phone](336) [phone] (After Hours)       Dental Extraction A dental extraction is the removal (extraction) of a tooth. You may need to have this procedure if:  You have tooth decay.  You have gum disease.  You have an infection (abscess).  Room needs to be made for other teeth to grow in or to be lined up properly.  Baby teeth are stopping your adult teeth from coming to the surface (erupting).  You have a tooth fracture or fractures that cannot be fixed.  You are going to get radiation to your head and neck. The type and length of procedure that you have depends on:  Why you are having a tooth removed.  Where the tooth is located that is being removed. The procedure may be:  A simple extraction. This is done if you can see the tooth in your mouth and it is above the gumline.  A surgical extraction. This is done if the tooth has not come into the mouth or if the tooth is broken off below the gumline. BEFORE THE PROCEDURE  Ask your doctor about:  Changing or stopping your regular medicines. This is important if you take diabetes medicines or blood thinners.  Taking medicines such as aspirin and ibuprofen. These medicines can thin your blood. Do not take these medicines before your procedure if your doctor tells you not to.  Take medicines  only as told by your doctor. This includes antibiotic medicines.  Ask your doctor if it is okay to eat or drink before your procedure.  Plan to have someone take you home after the procedure.  If you go home right after the procedure, plan to have someone with you for 24  hours. PROCEDURE  You may be given one or more of these:  A medicine that helps you relax (sedative).  A medicine that numbs the area (local anesthetic).  A medicine that makes you fall asleep (general anesthetic).  If you are having a simple extraction:  Your dentist will loosen the tooth. This is done with a tool called an elevator.  Your dentist will grab the tooth and remove it from the socket. This is done with a tool called forceps.  The open socket will be cleaned.  Gauze will be placed in the socket. This helps with bleeding.  If you are having a surgical extraction:  Your dentist will make a cut (incision) in the gum.  Some of the bone around the tooth may need to be removed.  The tooth will be removed.  Stitches (sutures) may be needed to close the area. The procedure may vary among doctors and hospitals.  AFTER THE PROCEDURE  You may have gauze in your mouth. If told by your doctor, gently press on the gauze for as long as one hour after the procedure.  A blood clot should start to form over the open socket. This is normal. Do not touch the area, and do not rinse it.  You may be given medicines to help with:  Pain.  Healing.   This information is not intended to replace advice given to you by your health care provider. Make sure you discuss any questions you have with your health care provider.   Document Released: 08/18/2014 Document Reviewed: 08/18/2014 Elsevier Interactive Patient Education 2016 Elsevier Inc[employer]  Dental Abscess A dental abscess is a collection of pus in or around a tooth. CAUSES This condition is caused by a bacterial infection around the root of the tooth that involves the inner part of the tooth (pulp). It may result from:  Severe tooth decay.  Trauma to the tooth that allows bacteria to enter into the pulp, such as a broken or chipped tooth.  Severe gum disease around a tooth. SYMPTOMS Symptoms of this condition  include:  Severe pain in and around the infected tooth.  Swelling and redness around the infected tooth, in the mouth, or in the face.  Tenderness.  Pus drainage.  Bad breath.  Bitter taste in the mouth.  Difficulty swallowing.  Difficulty opening the mouth.  Nausea.  Vomiting.  Chills.  Swollen neck glands.  Fever. DIAGNOSIS This condition is diagnosed with examination of the infected tooth. During the exam, your dentist may tap on the infected tooth. Your dentist will also ask about your medical and dental history and may order X-rays. TREATMENT This condition is treated by eliminating the infection. This may be done with:  Antibiotic medicine.  A root canal. This may be performed to save the tooth.  Pulling (extracting) the tooth. This may also involve draining the abscess. This is done if the tooth cannot be saved. HOME CARE INSTRUCTIONS  Take medicines only as directed by your dentist.  If you were prescribed antibiotic medicine, finish all of it even if you start to feel better.  Rinse your mouth (gargle) often with salt water to relieve pain or swelling.  Do not drive or operate heavy machinery while taking pain medicine.  Do not apply heat to the outside of your mouth.  Keep all follow-up visits as directed by your dentist. This is important. SEEK MEDICAL CARE IF:  Your pain is worse and is not helped by medicine. SEEK IMMEDIATE MEDICAL CARE IF:  You have a fever or chills.  Your symptoms suddenly get worse.  You have a very bad headache.  You have problems breathing or swallowing.  You have trouble opening your mouth.  You have swelling in your neck or around your eye.   This information is not intended to replace advice given to you by your health care provider. Make sure you discuss any questions you have with your health care provider.   Follow up with dentist AS SOON AS POSSIBLE for tooth extraction. Return to the Emergency Department  if you experience fever, inability to open mouth, severe facial swelling, difficulty breathing or swallowing, neck stiffness, severe headache.

## 2015-08-31 ENCOUNTER — Encounter (HOSPITAL_COMMUNITY): Payer: Self-pay | Admitting: *Deleted

## 2015-08-31 ENCOUNTER — Emergency Department (HOSPITAL_COMMUNITY)
Admission: EM | Admit: 2015-08-31 | Discharge: 2015-08-31 | Disposition: A | Discharge status: Home / Self Care | Payer: Self-pay | Attending: Dermatology | Admitting: Dermatology

## 2015-08-31 DIAGNOSIS — F17210 Nicotine dependence, cigarettes, uncomplicated: Secondary | ICD-10-CM | POA: Insufficient documentation

## 2015-08-31 DIAGNOSIS — Y929 Unspecified place or not applicable: Secondary | ICD-10-CM | POA: Insufficient documentation

## 2015-08-31 DIAGNOSIS — Z5321 Procedure and treatment not carried out due to patient leaving prior to being seen by health care provider: Secondary | ICD-10-CM | POA: Insufficient documentation

## 2015-08-31 DIAGNOSIS — Y939 Activity, unspecified: Secondary | ICD-10-CM | POA: Insufficient documentation

## 2015-08-31 DIAGNOSIS — Y990 Civilian activity done for income or pay: Secondary | ICD-10-CM | POA: Insufficient documentation

## 2015-08-31 DIAGNOSIS — W290XXA Contact with powered kitchen appliance, initial encounter: Secondary | ICD-10-CM | POA: Insufficient documentation

## 2015-08-31 DIAGNOSIS — S61213A Laceration without foreign body of left middle finger without damage to nail, initial encounter: Secondary | ICD-10-CM | POA: Insufficient documentation

## 2015-08-31 NOTE — ED Notes (Signed)
Called to treatment area 3 times over one hour period. No response to name

## 2015-08-31 NOTE — ED Notes (Signed)
Pt is here for finger laceration, he has a small lac (approx 1-2 cm) to top of left middle finger (from tomato slicer, while at work). Bleeding controlled at this time.

## 2015-08-31 NOTE — ED Notes (Signed)
Pt

## 2018-08-04 ENCOUNTER — Emergency Department (HOSPITAL_COMMUNITY)
Admission: EM | Admit: 2018-08-04 | Discharge: 2018-08-04 | Disposition: A | Discharge status: Home / Self Care | Payer: Self-pay | Attending: Emergency Medicine | Admitting: Emergency Medicine

## 2018-08-04 ENCOUNTER — Other Ambulatory Visit: Payer: Self-pay

## 2018-08-04 ENCOUNTER — Emergency Department (HOSPITAL_COMMUNITY): Payer: Self-pay

## 2018-08-04 ENCOUNTER — Encounter (HOSPITAL_COMMUNITY): Payer: Self-pay | Admitting: Emergency Medicine

## 2018-08-04 DIAGNOSIS — R05 Cough: Secondary | ICD-10-CM | POA: Insufficient documentation

## 2018-08-04 DIAGNOSIS — J029 Acute pharyngitis, unspecified: Secondary | ICD-10-CM | POA: Insufficient documentation

## 2018-08-04 DIAGNOSIS — J3489 Other specified disorders of nose and nasal sinuses: Secondary | ICD-10-CM | POA: Insufficient documentation

## 2018-08-04 DIAGNOSIS — F17290 Nicotine dependence, other tobacco product, uncomplicated: Secondary | ICD-10-CM | POA: Insufficient documentation

## 2018-08-04 DIAGNOSIS — Z79899 Other long term (current) drug therapy: Secondary | ICD-10-CM | POA: Insufficient documentation

## 2018-08-04 DIAGNOSIS — R059 Cough, unspecified: Secondary | ICD-10-CM

## 2018-08-04 NOTE — ED Provider Notes (Signed)
[facility]-EMERGENCY DEPT Provider Note   CSN: [id number] Arrival date & time: 08/04/18  1453    History   Chief Complaint Chief Complaint  Patient presents with  . Cough    HPI    Blake Contreras is a 33 y.o. male who presents to the ED with complaints of mild URI symptoms that began 3 days ago but have been gradually getting better.  Patient states that he missed work and needs to "make sure he does not have COVID-19 before he goes back".  He reports having a sore throat, mostly dry mild cough, and slight rhinorrhea.  He has been using Airborne and emergen-C which have helped.  No known aggravating factors.  He states that overall he is getting better.  He denies any recent sick contacts or COVID 19 exposures.  Denies recent travel.  He does not smoke cigarettes.  He denies any fevers, chills, ear pain or drainage, drooling, trismus, CP, SOB, abd pain, N/V/D/C, hematuria, dysuria, myalgias, arthralgias, numbness, tingling, focal weakness, rashes, or any other complaints at this time.   The history is provided by the patient and medical records. No language interpreter was used.  Cough  Associated symptoms: rhinorrhea and sore throat   Associated symptoms: no chest pain, no chills, no ear pain, no fever, no myalgias, no rash and no shortness of breath     History reviewed. No pertinent past medical history.  There are no active problems to display for this patient.   No past surgical history on file.      Home Medications    Prior to Admission medications   Medication Sig Start Date End Date Taking? Authorizing Provider  amoxicillin (AMOXIL) 500 MG tablet Take 2 tablets (1,000 mg total) by mouth 2 (two) times daily. 03/13/15   Dowless, [name], PA-C  Aspirin-Caffeine (BC FAST PAIN RELIEF PO) Take 2 packets by mouth every 8 (eight) hours as needed (for pain).    [provider]  ibuprofen (ADVIL,MOTRIN) 200 MG tablet Take 800 mg by mouth  every 6 (six) hours as needed for fever, headache or moderate pain.    [provider]  ibuprofen (ADVIL,MOTRIN) 800 MG tablet Take 1 tablet (800 mg total) by mouth 3 (three) times daily. Patient not taking: Reported on 03/13/2015 02/17/15   Arby BarrettePfeiffer, Marcy, MD  naproxen sodium (ALEVE) 220 MG tablet Take 440-660 mg by mouth 2 (two) times daily as needed (pain).    [provider]  ondansetron (ZOFRAN) 4 MG tablet Take 1 tablet (4 mg total) by mouth every 6 (six) hours. Patient not taking: Reported on 02/17/2015 04/11/14   Marlon PelGreene, Tiffany, PA-C  traMADol (ULTRAM) 50 MG tablet Take 1 tablet (50 mg total) by mouth every 6 (six) hours as needed. Patient not taking: Reported on 02/17/2015 04/11/14   Marlon PelGreene, Tiffany, PA-C  traMADol (ULTRAM) 50 MG tablet Take 2 tablets (100 mg total) by mouth every 6 (six) hours as needed (You may take this with ibuprofen if you need additional pain control.). 03/13/15   Dowless, Lester KinsmanSamantha Tripp, PA-C    Family History No family history on file.  Social History Social History   Tobacco Use  . Smoking status: Current Some Day Smoker    Types: Cigars  Substance Use Topics  . Alcohol use: Yes    Comment: occasionally  . Drug use: No    Comment: hx of marijuana     Allergies   Bee venom   Review of Systems Review of  Systems  Constitutional: Negative for chills and fever.  HENT: Positive for rhinorrhea and sore throat. Negative for drooling, ear discharge, ear pain and trouble swallowing.   Respiratory: Positive for cough. Negative for shortness of breath.   Cardiovascular: Negative for chest pain.  Gastrointestinal: Negative for abdominal pain, constipation, diarrhea, nausea and vomiting.  Genitourinary: Negative for dysuria and hematuria.  Musculoskeletal: Negative for arthralgias and myalgias.  Skin: Negative for color change and rash.  Allergic/Immunologic: Negative for immunocompromised state.  Neurological: Negative for weakness and  numbness.  Psychiatric/Behavioral: Negative for confusion.  All other systems reviewed and are negative for acute change except as noted in the HPI.     Physical Exam Updated Vital Signs BP 120/72   Pulse 75   Temp 98.5 F (36.9 C)   Resp 18   SpO2 99%    Physical Exam Vitals signs and nursing note reviewed.  Constitutional:      General: He is not in acute distress.    Appearance: Normal appearance. He is well-developed. He is not toxic-appearing.     Comments: Afebrile, nontoxic, NAD  HENT:     Head: Normocephalic and atraumatic.     Nose: Rhinorrhea present.     Mouth/Throat:     Mouth: Mucous membranes are moist.     Pharynx: Oropharynx is clear. Uvula midline. Posterior oropharyngeal erythema present. No pharyngeal swelling, oropharyngeal exudate or uvula swelling.     Tonsils: No tonsillar exudate or tonsillar abscesses.     Comments: Nose with mild rhinorrhea. Oropharynx very mildly injected, without uvular swelling or deviation, no trismus or drooling, no tonsillar swelling or erythema, no exudates.  No PTA.  Eyes:     General:        Right eye: No discharge.        Left eye: No discharge.     Conjunctiva/sclera: Conjunctivae normal.  Neck:     Musculoskeletal: Normal range of motion and neck supple.  Cardiovascular:     Rate and Rhythm: Normal rate and regular rhythm.     Pulses: Normal pulses.     Heart sounds: Normal heart sounds, S1 normal and S2 normal. No murmur. No friction rub. No gallop.   Pulmonary:     Effort: Pulmonary effort is normal. No respiratory distress.     Breath sounds: Normal breath sounds. No decreased breath sounds, wheezing, rhonchi or rales.     Comments: CTAB in all lung fields, no w/r/r, no hypoxia or increased WOB, speaking in full sentences, SpO2 99% on RA  Abdominal:     General: Bowel sounds are normal. There is no distension.     Palpations: Abdomen is soft. Abdomen is not rigid.     Tenderness: There is no abdominal tenderness.  There is no right CVA tenderness, left CVA tenderness, guarding or rebound. Negative signs include Murphy's sign and McBurney's sign.  Musculoskeletal: Normal range of motion.  Skin:    General: Skin is warm and dry.     Findings: No rash.  Neurological:     Mental Status: He is alert and oriented to person, place, and time.     Sensory: Sensation is intact. No sensory deficit.     Motor: Motor function is intact.  Psychiatric:        Mood and Affect: Mood and affect normal.        Behavior: Behavior normal.      ED Treatments / Results  Labs (all labs ordered are listed, but only abnormal results  are displayed) Labs Reviewed - No data to display  EKG None  Radiology Dg Chest [facility] 1 View  Result Date: 08/04/2018 CLINICAL DATA:  Cough and dry mouth. EXAM: PORTABLE CHEST 1 VIEW COMPARISON:  None. FINDINGS: The heart size and mediastinal contours are within normal limits. Both lungs are clear. The visualized skeletal structures are unremarkable. IMPRESSION: No active disease. Electronically Signed   By: Gerome Sam III M.D   On: 08/04/2018 16:04    Procedures Procedures (including critical care time)  Medications Ordered in ED Medications - No data to display   Initial Impression / Assessment and Plan / ED Course  I have reviewed the triage vital signs and the nursing notes.  Pertinent labs & imaging results that were available during my care of the patient were reviewed by me and considered in my medical decision making (see chart for details).        33 y.o. male here with sore throat and mostly dry cough x3 days, improving, but needs work clearance. On exam, throat mildly injected, no tonsillar swelling or exudates, nose with mild rhinorrhea, clear lung exam, afebrile and nontoxic. Doubt strep. Will get CXR to eval for PNA, but doubt need for further emergent work up or testing, doubt need for COVID19 testing currently. Will reassess shortly.   Blake Contreras was  evaluated in Emergency Department on 08/04/2018 for the symptoms described in the history of present illness. He was evaluated in the context of the global COVID-19 pandemic, which necessitated consideration that the patient might be at risk for infection with the SARS-CoV-2 virus that causes COVID-19. Institutional protocols and algorithms that pertain to the evaluation of patients at risk for COVID-19 are in a state of rapid change based on information released by regulatory bodies including the CDC and federal and state organizations. These policies and algorithms were followed during the patient's care in the ED.  4:35 PM CXR neg. Likely viral etiology, discussed that COVID19 is still a possibility. Advised self quarantine guidelines. Discussed OTC remedies for symptomatic relief. F/up with PCP in 1wk for recheck. I explained the diagnosis and have given explicit precautions to return to the ER including for any other new or worsening symptoms. The patient understands and accepts the medical plan as it's been dictated and I have answered their questions. Discharge instructions concerning home care and prescriptions have been given. The patient is STABLE and is discharged to home in good condition.     Final Clinical Impressions(s) / ED Diagnoses   Final diagnoses:  Cough  Viral pharyngitis    ED Discharge Orders    [address] [place], [place] 08/04/18 1640    Benjiman Core, MD 08/04/18 1654

## 2018-08-04 NOTE — ED Triage Notes (Signed)
Patient reports mild cough and "dry throat" x3 days. States "I called out of work on Thursday and just have to make sure I don't have COVID to go back."

## 2018-08-04 NOTE — ED Notes (Signed)
ED Provider at bedside. 

## 2018-08-04 NOTE — Discharge Instructions (Signed)
It's possible that you have a viral illness, we are not currently testing for the novel coronavirus infection so we can't tell you whether you have it or not. We're asking that patients self-quarantine until 7 days from symptom onset and at least 3 days fever free without medications. You should follow those guidelines.  Continue to stay well-hydrated. Gargle warm salt water and spit it out and use chloraseptic spray as needed for sore throat. Continue to alternate between Tylenol and Ibuprofen for pain or fever. Use Mucinex/Robitussin/etc for cough suppression/expectoration of mucus. Use over the counter flonase and the netipot to help with nasal congestion. May consider over-the-counter Benadryl or other antihistamine like Claritin/Zyrtec/etc to decrease secretions and for help with your symptoms. Follow up with your primary care doctor in 5-7 days for recheck of ongoing symptoms. Return to emergency department for emergent changing or worsening of symptoms.

## 2020-03-20 IMAGING — DX PORTABLE CHEST - 1 VIEW
2 series · 2 of 2 positions shown · non-contrast
Comparison: None.

CLINICAL DATA: Cough and dry mouth.

EXAM:
PORTABLE CHEST 1 VIEW

[chest ap (1 of 2)]
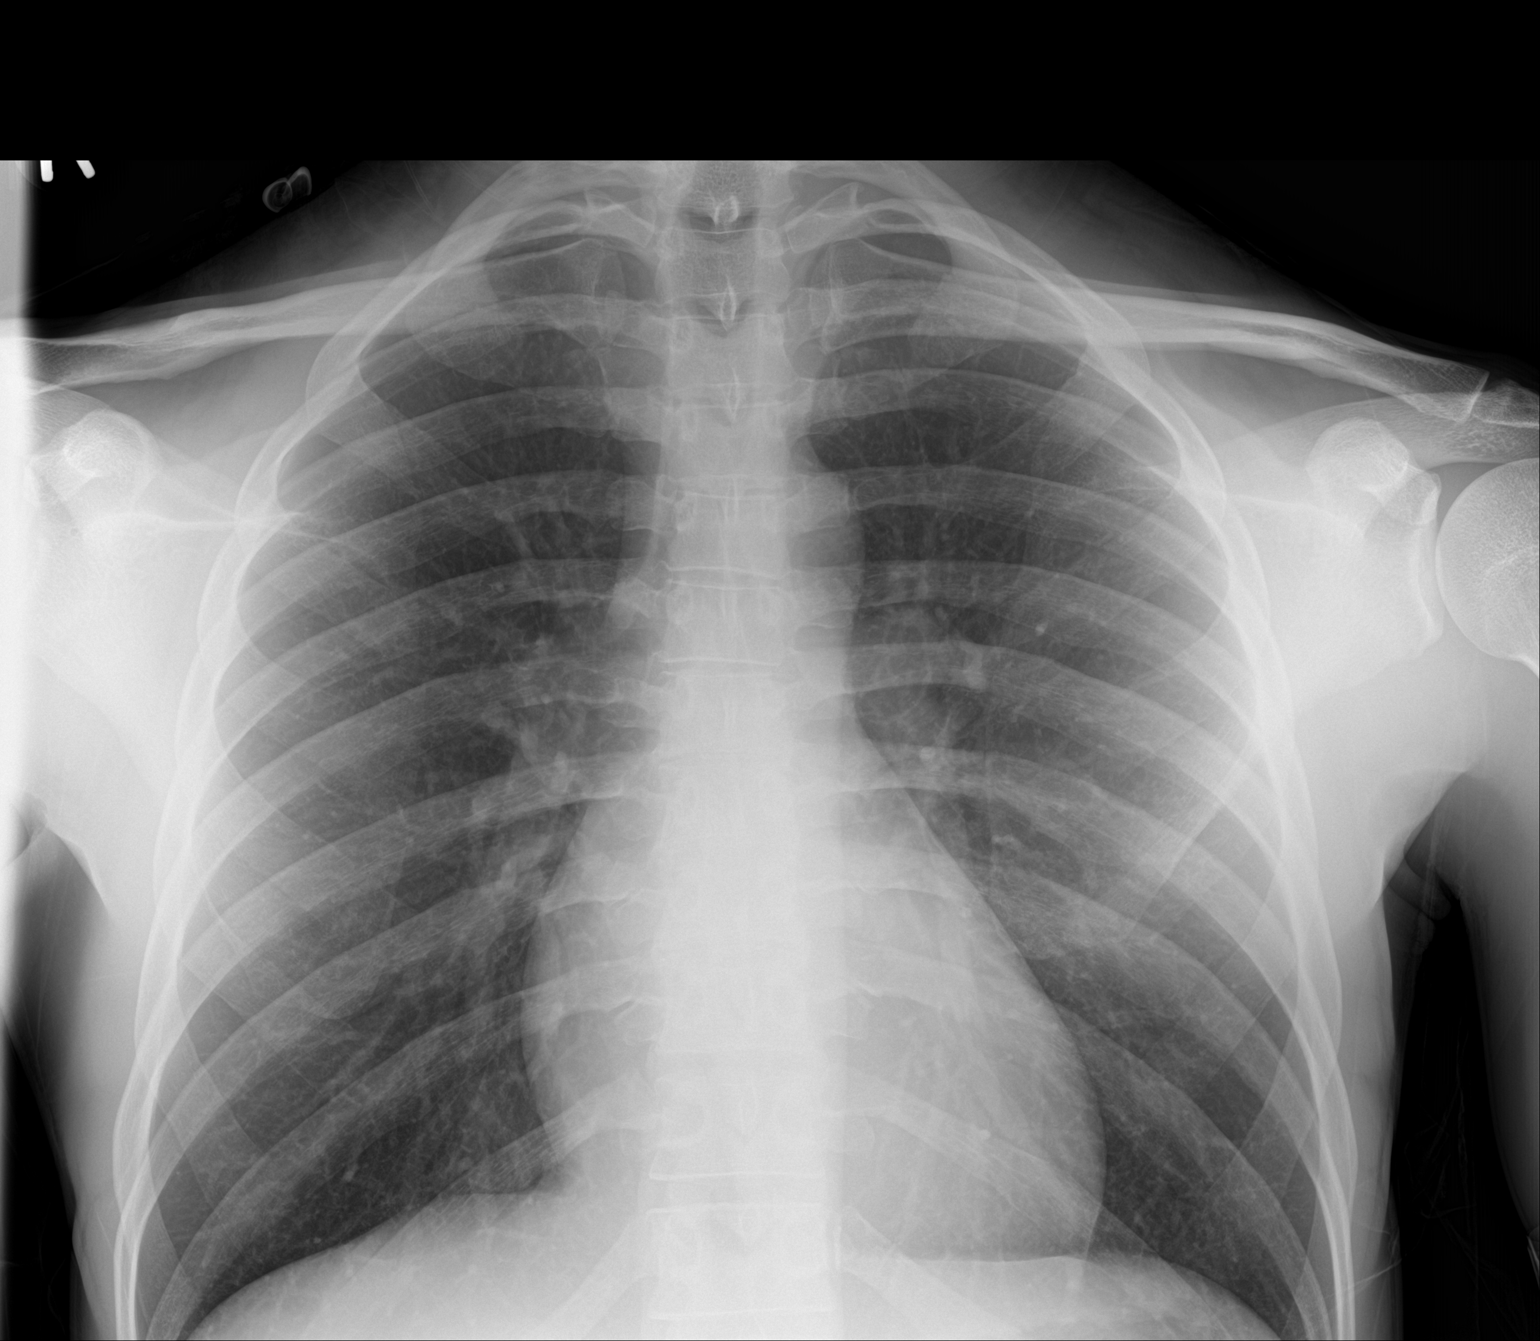

[chest ap (2 of 2)]
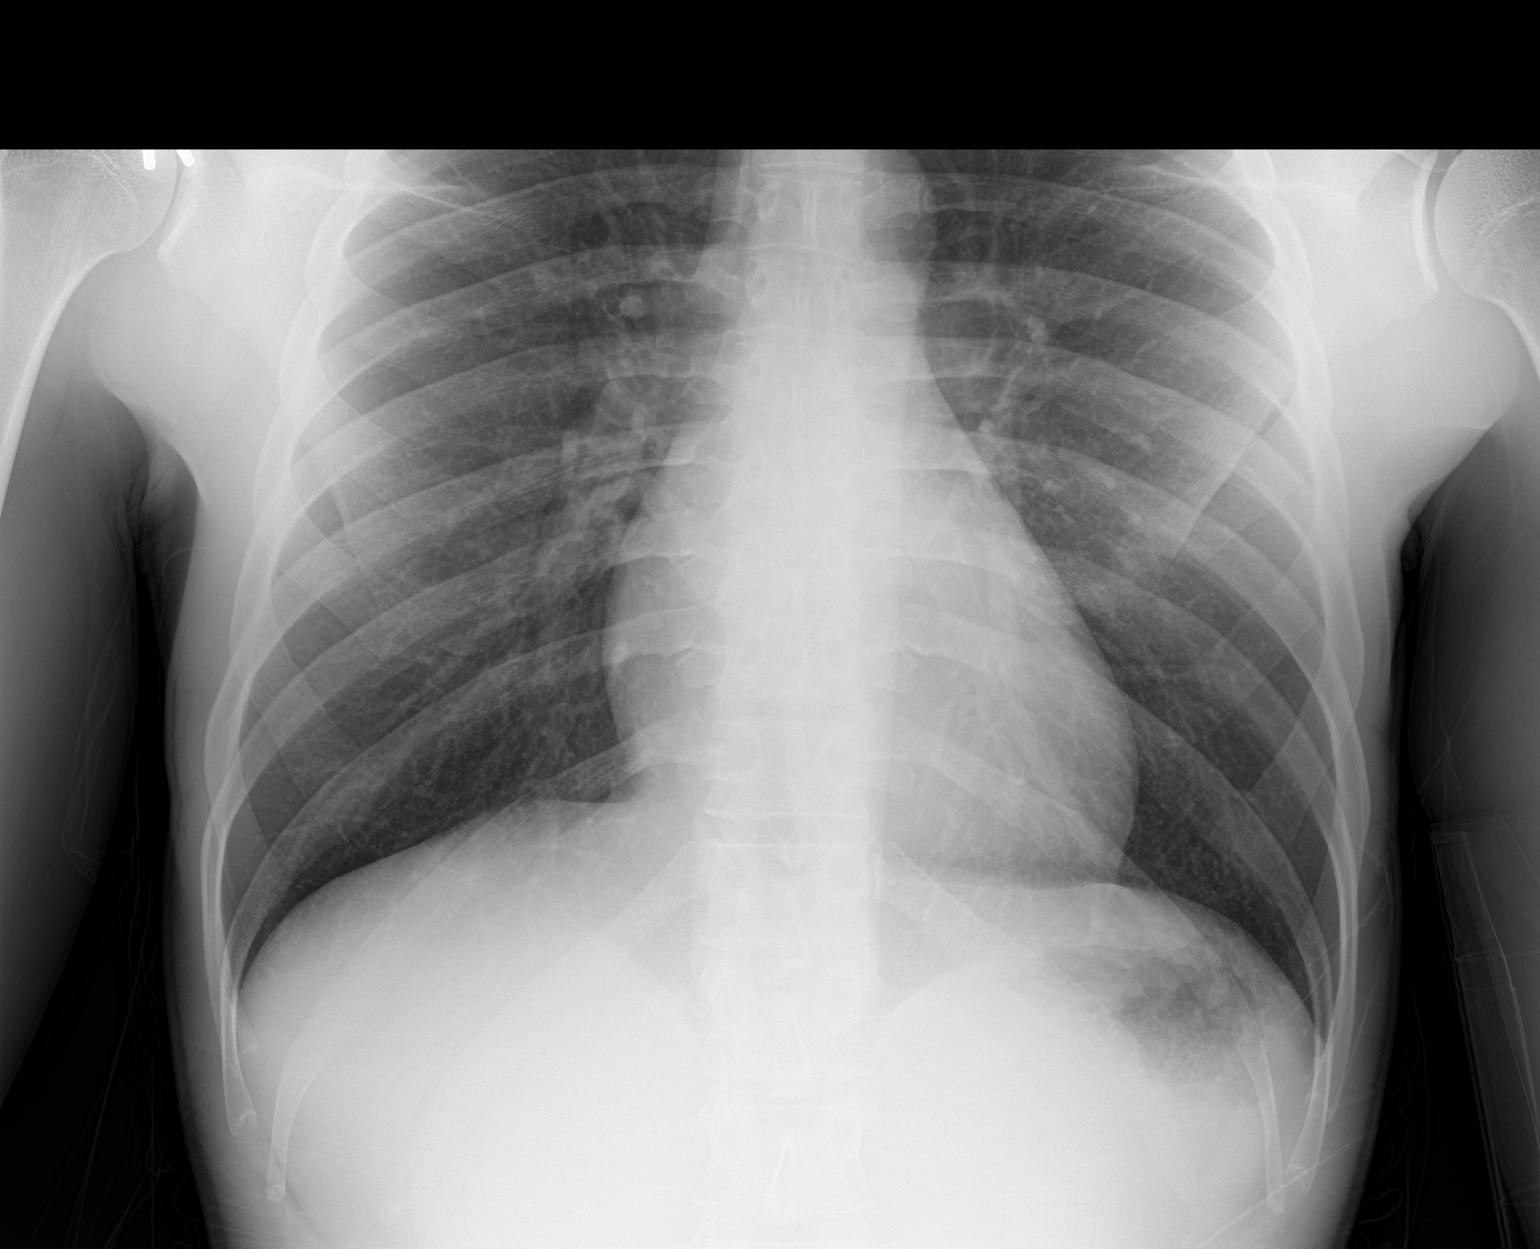

[2 of 2 positions shown; findings below may reference images not displayed]

FINDINGS: The heart size and mediastinal contours are within normal limits.
Both lungs are clear. The visualized skeletal structures are
unremarkable.
IMPRESSION: No active disease.

## 2023-01-24 ENCOUNTER — Ambulatory Visit: Payer: Self-pay

## 2023-01-24 ENCOUNTER — Other Ambulatory Visit: Payer: Self-pay | Admitting: Nurse Practitioner

## 2023-01-24 DIAGNOSIS — Z021 Encounter for pre-employment examination: Secondary | ICD-10-CM
